# Patient Record
Sex: Female | Born: 1985 | Race: Black or African American | Hispanic: No | Marital: Single | Smoking: Never smoker
Health system: Southern US, Community
[De-identification: ages and names within clinical notes are randomized; demographics above are authoritative.]

## PROBLEM LIST (undated history)

## (undated) DIAGNOSIS — J302 Other seasonal allergic rhinitis: Secondary | ICD-10-CM

## (undated) DIAGNOSIS — K219 Gastro-esophageal reflux disease without esophagitis: Secondary | ICD-10-CM

## (undated) DIAGNOSIS — Z8489 Family history of other specified conditions: Secondary | ICD-10-CM

## (undated) DIAGNOSIS — Q909 Down syndrome, unspecified: Secondary | ICD-10-CM

## (undated) DIAGNOSIS — F329 Major depressive disorder, single episode, unspecified: Secondary | ICD-10-CM

## (undated) DIAGNOSIS — E7800 Pure hypercholesterolemia, unspecified: Secondary | ICD-10-CM

## (undated) DIAGNOSIS — F32A Depression, unspecified: Secondary | ICD-10-CM

## (undated) DIAGNOSIS — J45909 Unspecified asthma, uncomplicated: Secondary | ICD-10-CM

## (undated) HISTORY — PX: EYE SURGERY: SHX253

## (undated) HISTORY — PX: AXILLARY SURGERY: SHX892

## (undated) HISTORY — DX: Unspecified asthma, uncomplicated: J45.909

## (undated) HISTORY — DX: Gastro-esophageal reflux disease without esophagitis: K21.9

## (undated) HISTORY — DX: Down syndrome, unspecified: Q90.9

## (undated) HISTORY — DX: Other seasonal allergic rhinitis: J30.2

## (undated) HISTORY — DX: Depression, unspecified: F32.A

## (undated) HISTORY — PX: OTHER SURGICAL HISTORY: SHX169

## (undated) HISTORY — DX: Major depressive disorder, single episode, unspecified: F32.9

## (undated) HISTORY — PX: TYMPANOSTOMY TUBE PLACEMENT: SHX32

## (undated) HISTORY — DX: Pure hypercholesterolemia, unspecified: E78.00

## (undated) HISTORY — DX: Family history of other specified conditions: Z84.89

---

## 2016-09-13 ENCOUNTER — Encounter: Payer: Self-pay | Admitting: Family Medicine

## 2016-09-13 ENCOUNTER — Ambulatory Visit (INDEPENDENT_AMBULATORY_CARE_PROVIDER_SITE_OTHER): Payer: Medicaid Other | Admitting: Family Medicine

## 2016-09-13 ENCOUNTER — Other Ambulatory Visit: Payer: Self-pay | Admitting: *Deleted

## 2016-09-13 VITALS — BP 116/76 | HR 101 | Temp 98.1°F | Resp 16 | Ht <= 58 in | Wt 215.0 lb

## 2016-09-13 DIAGNOSIS — J309 Allergic rhinitis, unspecified: Secondary | ICD-10-CM

## 2016-09-13 DIAGNOSIS — Q909 Down syndrome, unspecified: Secondary | ICD-10-CM

## 2016-09-13 DIAGNOSIS — Z8639 Personal history of other endocrine, nutritional and metabolic disease: Secondary | ICD-10-CM | POA: Diagnosis not present

## 2016-09-13 DIAGNOSIS — E785 Hyperlipidemia, unspecified: Secondary | ICD-10-CM

## 2016-09-13 DIAGNOSIS — L83 Acanthosis nigricans: Secondary | ICD-10-CM

## 2016-09-13 DIAGNOSIS — K219 Gastro-esophageal reflux disease without esophagitis: Secondary | ICD-10-CM | POA: Diagnosis not present

## 2016-09-13 DIAGNOSIS — E559 Vitamin D deficiency, unspecified: Secondary | ICD-10-CM | POA: Insufficient documentation

## 2016-09-13 DIAGNOSIS — J302 Other seasonal allergic rhinitis: Secondary | ICD-10-CM | POA: Diagnosis not present

## 2016-09-13 DIAGNOSIS — Z8659 Personal history of other mental and behavioral disorders: Secondary | ICD-10-CM | POA: Diagnosis not present

## 2016-09-13 MED ORDER — LORATADINE 10 MG PO TABS: 10.0000 mg | ORAL_TABLET | Freq: Every day | ORAL | 2 refills | Status: DC

## 2016-09-13 MED ORDER — FLUTICASONE PROPIONATE 50 MCG/ACT NA SUSP: 2.0000 | Freq: Every day | NASAL | 5 refills | Status: DC

## 2016-09-13 NOTE — Assessment & Plan Note (Addendum)
BMI >45, recent weight loss 2 lb in 1 year, limited regular exercise. Concern with Down syndrome, at inc risk wt gain, metabolic complication - Check future fasting lipid panel, chemistry, A1c - Encouraged regular exercise and healthy diet, will discuss further at follow-up with lab results, and may need referral to Nutrition center

## 2016-09-13 NOTE — Assessment & Plan Note (Signed)
H/o HLD without prior records. Not on statin therapy. Morbid obesity BMI >45, concern with family history and Down Syndrome. - Check future fasting lipid panel, glucose/A1c

## 2016-09-13 NOTE — Patient Instructions (Signed)
Thank you for coming in to clinic today.  1. It sounds like you have persistent Sinus Congestion or "Rhinosinusitis" - I do not think that this is a Bacterial Sinus Infection. Usually these are caused by Viruses or Allergies - Improve hydration by drinking plenty of clear fluids (water, gatorade) to reduce secretions and thin congestion - Start taking Flonase nasal spray 2 in each nostril each day for next 4 weeks, may need for longer - Start Loratadine (Claritin) 1 month trial - if helping can continue or resume during allergy season - Congestion draining down throat can cause irritation. May try warm herbal tea with honey, cough drops  If you develop persistent fever >101F for at least 3 consecutive days, headaches with sinus pain or pressure or persistent earache, please schedule a follow-up evaluation within next few days to week.  (anytime in next 1-2 weeks)... - Please schedule a "Lab Only" visit (early morning, 8:00am to 9:00am) to get your blood work drawn here at our clinic. - You need to be fasting (No food or drink after midnight, and nothing in the morning before your blood draw). - I have already ordered your blood work, so you may schedule this appointment at your convenience - We can discuss results at next appointment, otherwise our office will contact you with results by phone or with a letter  Please schedule a follow-up appointment with Dr. Althea CharonKaramalegos in 3-4 weeks for Lab Follow-up / Sinusitis Follow-up  Recommend Flu Shot Recommend future Pap Smear cervical cancer screening  Will request records  We may discuss future referrals to other specialist given her history of Down Syndrome  If you have any other questions or concerns, please feel free to call the clinic or send a message through MyChart. You may also schedule an earlier appointment if necessary.  Saralyn PilarAlexander Karamalegos, DO [facility]

## 2016-09-13 NOTE — Assessment & Plan Note (Signed)
On exam, concern for inc risk insulin resistance with morbid obesity Screen for DM with A1c, chemistry

## 2016-09-13 NOTE — Assessment & Plan Note (Signed)
Stable, without active problems. No medications Monitor

## 2016-09-13 NOTE — Assessment & Plan Note (Signed)
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 - Afebrile, well-appearing, sinuses non-tender without purulence  Plan: 1. Reassurance, likely self-limited - no indication for antibiotics at this time 2. Start Claritin 10mg  OTC trial x 1 month, may continue if improved 3. Continue Flonase 2 sprays daily x 1 month, may continue if improved then PRN 4. Return criteria provided 5. Follow-up as planned within 4 weeks for fasting labs, and f/u sinuses
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
## 2016-09-13 NOTE — Assessment & Plan Note (Signed)
Suspect contributing to lingering sinus symptoms. Prior history. Not on any medications Start Loratadine 10mg  daily for 1 month trial and continue as needed Start Flonase

## 2016-09-13 NOTE — Assessment & Plan Note (Signed)
Chronic problem, reported history per Mother (primary caregiver), currently do not have any childhood or recent healthcare records, lab results or testing. 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 No documented history of Atlanto-Axial-Instability (AAI) and no evidence of recent problem, or indication to screen (such as anesthesia/surgery or high risk activity)  Plan: 1. Awaiting records from various locations, [facility] also [facility] [place] - release of records to be obtained by our office 2. Ordered future fasting labs / screening - CMET, Lipids, TSH / Free T4, Vitamin D, A1c 3. Follow-up within 4 weeks review lab results, ideally will have outside records, otherwise if after several months unable to locate records then will consider referrals out to Cardiology, Endocrinology, Ophthalmology to establish care
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
## 2016-09-14 ENCOUNTER — Other Ambulatory Visit: Payer: Self-pay

## 2016-09-14 LAB — CBC WITH DIFFERENTIAL/PLATELET
BASOS PCT: 0 %
Basophils Absolute: 0 cells/uL (ref 0–200)
EOS PCT: 1 %
Eosinophils Absolute: 34 cells/uL (ref 15–500)
HCT: 39.5 % (ref 35.0–45.0)
Hemoglobin: 13.3 g/dL (ref 11.7–15.5)
LYMPHS ABS: 1020 {cells}/uL (ref 850–3900)
Lymphocytes Relative: 30 %
MCH: 33.5 pg — AB (ref 27.0–33.0)
MCHC: 33.7 g/dL (ref 32.0–36.0)
MCV: 99.5 fL (ref 80.0–100.0)
MONOS PCT: 7 %
MPV: 10.1 fL (ref 7.5–12.5)
Monocytes Absolute: 238 cells/uL (ref 200–950)
NEUTROS ABS: 2108 {cells}/uL (ref 1500–7800)
Neutrophils Relative %: 62 %
PLATELETS: 196 10*3/uL (ref 140–400)
RBC: 3.97 MIL/uL (ref 3.80–5.10)
RDW: 15 % (ref 11.0–15.0)
WBC: 3.4 10*3/uL — AB (ref 3.8–10.8)

## 2016-09-14 LAB — COMPLETE METABOLIC PANEL WITH GFR
ALT: 19 U/L (ref 6–29)
AST: 18 U/L (ref 10–30)
Albumin: 3.6 g/dL (ref 3.6–5.1)
Alkaline Phosphatase: 67 U/L (ref 33–115)
BILIRUBIN TOTAL: 0.5 mg/dL (ref 0.2–1.2)
BUN: 7 mg/dL (ref 7–25)
CHLORIDE: 103 mmol/L (ref 98–110)
CO2: 27 mmol/L (ref 20–31)
CREATININE: 0.76 mg/dL (ref 0.50–1.10)
Calcium: 8.3 mg/dL — ABNORMAL LOW (ref 8.6–10.2)
GFR, Est African American: >89 mL/min (ref >=60)
GFR, Est Non African American: >89 mL/min (ref >=60)
GLUCOSE: 88 mg/dL (ref 65–99)
Potassium: 4.4 mmol/L (ref 3.5–5.3)
SODIUM: 141 mmol/L (ref 135–146)
TOTAL PROTEIN: 6.6 g/dL (ref 6.1–8.1)

## 2016-09-14 LAB — LIPID PANEL
Cholesterol: 180 mg/dL (ref 125–200)
HDL: 39 mg/dL — AB (ref >=46)
LDL CALC: 120 mg/dL (ref <130)
Total CHOL/HDL Ratio: 4.6 Ratio (ref <=5.0)
Triglycerides: 105 mg/dL (ref <150)
VLDL: 21 mg/dL (ref <30)

## 2016-09-14 LAB — TSH: TSH: 2.67 m[IU]/L

## 2016-09-14 LAB — T4, FREE: FREE T4: 1.2 ng/dL (ref 0.8–1.8)

## 2016-09-15 LAB — HEMOGLOBIN A1C
HEMOGLOBIN A1C: 5.2 % (ref <5.7)
Mean Plasma Glucose: 103 mg/dL

## 2016-09-15 LAB — VITAMIN D 25 HYDROXY (VIT D DEFICIENCY, FRACTURES): Vit D, 25-Hydroxy: 13 ng/mL — ABNORMAL LOW (ref 30–100)

## 2016-09-19 ENCOUNTER — Other Ambulatory Visit: Payer: Self-pay | Admitting: Family Medicine

## 2016-09-19 DIAGNOSIS — E559 Vitamin D deficiency, unspecified: Secondary | ICD-10-CM

## 2016-09-19 MED ORDER — VITAMIN D3 125 MCG (5000 UT) PO CAPS: 5000.0000 [IU] | ORAL_CAPSULE | Freq: Every day | ORAL | 0 refills | Status: DC

## 2016-10-11 ENCOUNTER — Ambulatory Visit: Payer: Self-pay | Admitting: Family Medicine

## 2016-10-13 ENCOUNTER — Ambulatory Visit (INDEPENDENT_AMBULATORY_CARE_PROVIDER_SITE_OTHER): Payer: Medicaid Other | Admitting: Family Medicine

## 2016-10-13 ENCOUNTER — Encounter: Payer: Self-pay | Admitting: Family Medicine

## 2016-10-13 VITALS — BP 112/76 | HR 70 | Temp 98.8°F | Resp 16 | Ht 59.5 in | Wt 213.6 lb

## 2016-10-13 DIAGNOSIS — E559 Vitamin D deficiency, unspecified: Secondary | ICD-10-CM | POA: Diagnosis not present

## 2016-10-13 DIAGNOSIS — Z8709 Personal history of other diseases of the respiratory system: Secondary | ICD-10-CM | POA: Insufficient documentation

## 2016-10-13 DIAGNOSIS — J309 Allergic rhinitis, unspecified: Secondary | ICD-10-CM

## 2016-10-13 DIAGNOSIS — Z8669 Personal history of other diseases of the nervous system and sense organs: Secondary | ICD-10-CM | POA: Insufficient documentation

## 2016-10-13 DIAGNOSIS — J302 Other seasonal allergic rhinitis: Secondary | ICD-10-CM

## 2016-10-13 NOTE — Assessment & Plan Note (Signed)
Controlled, x 3 weeks on flonase / claritin, now off. Advised she may resume these for 2-4 weeks in future may need for longer if recurrence. - start daily nasal saline

## 2016-10-13 NOTE — Progress Notes (Addendum)
Subjective:    Patient ID: Molly Harris, female    DOB: [birth year], 30 y.o.   MRN: [id number]  Molly Harris is a 30 y.o. female presenting on 10/13/2016 for Sinusitis   HPI   FOLLOW-UP ALLERGIC RHINOSINUSITIS / SEASONAL ALLERGIES: - Chronic history with recurrent URI and sinus infections and seasonal allergies.  - Last visit 9/20, started back on Flonase and Claritin daily, given recurrent allergic sinusitis symptoms, she was not having symptoms of acute sinus infection at that time. - Currently significant improvement. She took both Flonase and Claritin regularly for 3 weeks with resolution of nasal congestion, post nasal drip and cough, and then meds were stopped about 1 week ago - Admits itchy nose - Denies any recent fevers/chills, productive cough, sinus pressure or headache, nausea, vomiting, nasal congestion, ear pain sore throat, nose bleeds  Vitamin D Deficiency - Last visit 09/13/16, had labs done confirming Vit D deficiency with result of 13. She has a prior history of deficiency on labwork >1 year prior, but was not treated and is no longer taking vitamin supplement. Currently she has not started the Vitamin D3 5,000 daily supplement as prescribed due to cost and not covered by ins   Past Medical History:  Diagnosis Date  . Asthma   . Depression   . Down syndrome   . Family history of headaches   . GERD (gastroesophageal reflux disease)   . High cholesterol   . Seasonal allergies    Per HPI unless specifically indicated above     Objective:    BP 112/76 (BP Location: Left Arm, Patient Position: Sitting, Cuff Size: Large)   Pulse 70   Temp 98.8 F (37.1 C) (Oral)   Resp 16   Ht 4' 11.5" (1.511 m)   Wt 213 lb 9.6 oz (96.9 kg)   LMP 10/04/2015   BMI 42.42 kg/m   Wt Readings from Last 3 Encounters:  10/13/16 213 lb 9.6 oz (96.9 kg)  09/13/16 215 lb (97.5 kg)    Physical Exam  Constitutional: She is oriented to person, place, and time. She appears  well-developed and well-nourished. No distress.  Mild Down Syndromic facies, well-appearing, comfortable, cooperative.  HENT:  Head: Normocephalic and atraumatic.  Mouth/Throat: Oropharynx is clear and moist.  Frontal / maxillary sinuses non-tender. Improved bilateral turbinates with reduced edema and resolved congestion. Bilateral TMs clear without erythema, effusion or bulging. Oropharynx clear without erythema, exudates, edema or asymmetry.  Eyes: Conjunctivae are normal Cardiovascular: Normal rate Pulmonary/Chest: Effort normal and breath sounds normal. No respiratory distress. She has no wheezes. She has no rales.  Lymphadenopathy:    She has no cervical adenopathy.  Neurological: She is alert Skin: Skin is warm and dry. No rash noted.  Nursing note and vitals reviewed.  I have personally reviewed lab results below from 09/13/16.  Results for orders placed or performed in visit on 09/13/16  COMPLETE METABOLIC PANEL WITH GFR  Result Value Ref Range   Sodium 141 135 - 146 mmol/L   Potassium 4.4 3.5 - 5.3 mmol/L   Chloride 103 98 - 110 mmol/L   CO2 27 20 - 31 mmol/L   Glucose, Bld 88 65 - 99 mg/dL   BUN 7 7 - 25 mg/dL   Creat 0.76 0.50 - 1.10 mg/dL   Total Bilirubin 0.5 0.2 - 1.2 mg/dL   Alkaline Phosphatase 67 33 - 115 U/L   AST 18 10 - 30 U/L   ALT 19 6 - 29 U/L  Total Protein 6.6 6.1 - 8.1 g/dL   Albumin 3.6 3.6 - 5.1 g/dL   Calcium 8.3 (L) 8.6 - 10.2 mg/dL   GFR, Est African American >89 >=60 mL/min   GFR, Est Non African American >89 >=60 mL/min  Lipid panel  Result Value Ref Range   Cholesterol 180 125 - 200 mg/dL   Triglycerides 105 <150 mg/dL   HDL 39 (L) >=46 mg/dL   Total CHOL/HDL Ratio 4.6 <=5.0 Ratio   VLDL 21 <30 mg/dL   LDL Cholesterol 120 <130 mg/dL  CBC with Differential/Platelet  Result Value Ref Range   WBC 3.4 (L) 3.8 - 10.8 K/uL   RBC 3.97 3.80 - 5.10 MIL/uL   Hemoglobin 13.3 11.7 - 15.5 g/dL   HCT 39.5 35.0 - 45.0 %   MCV 99.5 80.0 - 100.0 fL     MCH 33.5 (H) 27.0 - 33.0 pg   MCHC 33.7 32.0 - 36.0 g/dL   RDW 15.0 11.0 - 15.0 %   Platelets 196 140 - 400 K/uL   MPV 10.1 7.5 - 12.5 fL   Neutro Abs 2,108 1,500 - 7,800 cells/uL   Lymphs Abs 1,020 850 - 3,900 cells/uL   Monocytes Absolute 238 200 - 950 cells/uL   Eosinophils Absolute 34 15 - 500 cells/uL   Basophils Absolute 0 0 - 200 cells/uL   Neutrophils Relative % 62 %   Lymphocytes Relative 30 %   Monocytes Relative 7 %   Eosinophils Relative 1 %   Basophils Relative 0 %   Smear Review Criteria for review not met   TSH  Result Value Ref Range   TSH 2.67 mIU/L  T4, free  Result Value Ref Range   Free T4 1.2 0.8 - 1.8 ng/dL  VITAMIN D 25 Hydroxy (Vit-D Deficiency, Fractures)  Result Value Ref Range   Vit D, 25-Hydroxy 13 (L) 30 - 100 ng/mL  Hemoglobin A1c  Result Value Ref Range   Hgb A1c MFr Bld 5.2 <5.7 %   Mean Plasma Glucose 103 mg/dL      Assessment & Plan:   Problem List Items Addressed This Visit    Vitamin D deficiency    Last checked 08/2016, low at 13 - May start any available OTC 2,000 to 5,000 daily for now, given not covered rx 5,000 daily - If takes supplement, re-check q 3-6 months      Seasonal allergies    Controlled, x 3 weeks on flonase / claritin, now off. Advised she may resume these for 2-4 weeks in future may need for longer if recurrence. - start daily nasal saline      Allergic sinusitis - Primary    Resolved, s/p flonase and claritin x 3 weeks, since stopped, remains controlled. - Advised if symptoms recur, restart both flonase and claritin as soon as possible for another 2-4 week course, may need for longer prevention during certain allergy seasons - Recommend daily nasal saline OTC to prevent worsening, also improve itchy nose       Other Visit Diagnoses   None.     No orders of the defined types were placed in this encounter.     Follow up plan: Return in about 6 months (around 04/13/2017) for physical / routine  follow-up.  Nobie Putnam, [facility] 10/13/2016, 1:08 PM

## 2016-10-13 NOTE — Patient Instructions (Signed)
Thank you for coming in to clinic today.  1. To prevent more sinus problems and help reduce itchy nose - Use Simply Saline or Ocean Spray, normal saline nasal spray over the counter, 1 to 2 sprays each nostril a day as needed  If you get worsening nasal congestion and sinus symptoms, please resume the medicines - Flonase nasal spray 2 in each nostril each day for 2 to 4 weeks at a time then may stop - Loratadine (Claritin) 1 pill daily for 2 to 4 weeks then may stop  Please schedule a follow-up appointment with Dr. Althea CharonKaramalegos in 6 months as needed for next routine check-up  If you have any other questions or concerns, please feel free to call the clinic or send a message through MyChart. You may also schedule an earlier appointment if necessary.  Saralyn PilarAlexander Zakyla Tonche, DO [facility]

## 2016-10-13 NOTE — Assessment & Plan Note (Signed)
Resolved, s/p flonase and claritin x 3 weeks, since stopped, remains controlled. - Advised if symptoms recur, restart both flonase and claritin as soon as possible for another 2-4 week course, may need for longer prevention during certain allergy seasons - Recommend daily nasal saline OTC to prevent worsening, also improve itchy nose

## 2016-10-13 NOTE — Assessment & Plan Note (Addendum)
Last checked 08/2016, low at 13 - May start any available OTC 2,000 to 5,000 daily for now, given not covered rx 5,000 daily - If takes supplement, re-check q 3-6 months

## 2016-11-21 ENCOUNTER — Telehealth: Payer: Self-pay | Admitting: Family Medicine

## 2016-11-21 DIAGNOSIS — Z124 Encounter for screening for malignant neoplasm of cervix: Secondary | ICD-10-CM

## 2016-11-21 NOTE — Telephone Encounter (Signed)
Referral to GYN for routine pap smear screening.  [facility] [address], [address] [place], [place] [address] Hours: 8am - 5pm Main: [phone]  May notify patient's mother that referral was placed and she will be notified to scheduled in future.  Saralyn PilarAlexander Daylee Delahoz, DO [facility] 11/21/2016, 5:00 PM

## 2016-11-21 NOTE — Telephone Encounter (Signed)
Rene KocherRegina asked if pt needed a referral to see GYN.  Her call back number is [phone]

## 2017-01-18 ENCOUNTER — Encounter: Payer: Medicaid Other | Admitting: Obstetrics and Gynecology

## 2017-02-21 ENCOUNTER — Encounter: Payer: Medicaid Other | Admitting: Obstetrics and Gynecology

## 2017-04-10 ENCOUNTER — Encounter: Payer: Medicaid Other | Admitting: Obstetrics and Gynecology

## 2017-04-19 ENCOUNTER — Encounter: Payer: Self-pay | Admitting: Nurse Practitioner

## 2017-04-19 ENCOUNTER — Ambulatory Visit (INDEPENDENT_AMBULATORY_CARE_PROVIDER_SITE_OTHER): Payer: Medicaid Other | Admitting: Nurse Practitioner

## 2017-04-19 VITALS — BP 103/67 | HR 86 | Temp 98.7°F | Resp 16 | Ht 59.5 in | Wt 210.0 lb

## 2017-04-19 DIAGNOSIS — R1110 Vomiting, unspecified: Secondary | ICD-10-CM

## 2017-04-19 DIAGNOSIS — L659 Nonscarring hair loss, unspecified: Secondary | ICD-10-CM | POA: Diagnosis not present

## 2017-04-19 DIAGNOSIS — E559 Vitamin D deficiency, unspecified: Secondary | ICD-10-CM | POA: Diagnosis not present

## 2017-04-19 DIAGNOSIS — R1115 Cyclical vomiting syndrome unrelated to migraine: Secondary | ICD-10-CM

## 2017-04-19 DIAGNOSIS — K219 Gastro-esophageal reflux disease without esophagitis: Secondary | ICD-10-CM | POA: Diagnosis not present

## 2017-04-19 MED ORDER — PANTOPRAZOLE SODIUM 40 MG PO TBEC: 40.0000 mg | DELAYED_RELEASE_TABLET | Freq: Every day | ORAL | 1 refills | Status: AC

## 2017-04-19 NOTE — Progress Notes (Signed)
Subjective:    Patient ID: Molly Harris, female    DOB: 30-Dec-1985, 31 y.o.   MRN: [id number]  Molly Harris is a 31 y.o. female presenting on 04/19/2017 for Emesis (Patient here today with her mother ([name]) who reports that pt has been vomiting at least once a day x's several weeks. Patient's mother reports vomiting after eating. [name] is also concerned about patients thyroid. ) and Alopecia Molly Harris has down's syndrome and is mostly non-verbal.  She is able to indicate yes and no, but is shy when communicating with provider.  Rollene Fare is helpful with obtaining some answers, however overall poor history.    HPI  Hair loss Patient's mother states she is having clumps of hair break off.  She is noticing significant breakage and hair loss pattern at the hair line.  Recently washed and not noticeable today.  She states Molly Harris's hair has shortened in length by about 7 inches without a haircut.    Vomiting Vomiting daily at least once per day that occurs about 1 hour after eating that has lasted for months according to pts mother.  Vomiting occurs usually after breakfast and if 2nd occurrence it occurs after dinner.  Heaves sometimes without vomiting. Denies abdominal pain.  She has no known coffee ground emesis, blood in stool, or black/tarry stool.  Mother states she goes to the bathroom frequently, but Takeshia prefers her privacy and mother is unable to provide information about stool characteristics or actual BM frequency.  Pt has a history of GERD and has not had any recent symptoms of heartburn.  Sometimes she complains of headaches, but [place] indicates it has not been hurting lately.    She only eats two meals per day.  Breakfast is a small meal with cereal or a slightly larger meal with a couple pancakes with bacon/sausage and fruit or juice. Dinner is often a moderately sized meal with sandwich and salad, or meat with veggies and bread.  She does have some snacks through the  day and they are usually fruit or a package of mini-muffins.   Vitamin D deficiency Rollene Fare notes they have stopped taking the vitamin D supplement.  She is concerned about vitamin D levels and desires to have them checked again today.   Past Medical History:  Diagnosis Date  . Asthma   . Depression   . Down syndrome   . Family history of headaches   . GERD (gastroesophageal reflux disease)   . High cholesterol   . Seasonal allergies     Social History  Substance Use Topics  . Smoking status: Never Smoker  . Smokeless tobacco: Never Used  . Alcohol use No    Review of Systems Per HPI unless specifically indicated above     Objective:    BP 103/67 (BP Location: Left Arm, Patient Position: Sitting, Cuff Size: Large)   Pulse 86   Temp 98.7 F (37.1 C) (Oral)   Resp 16   Ht 4' 11.5" (1.511 m)   Wt 210 lb (95.3 kg)   LMP 02/23/2017 (Within Weeks)   SpO2 99%   BMI 41.71 kg/m   Wt Readings from Last 3 Encounters:  04/19/17 210 lb (95.3 kg)  10/13/16 213 lb 9.6 oz (96.9 kg)  09/13/16 215 lb (97.5 kg)     Physical Exam  Constitutional: She appears well-developed and well-nourished. No distress.  obese  HENT:  Head: Normocephalic and atraumatic.  Nose: Nose normal.  Mouth/Throat: Oropharynx is clear and moist. No  oropharyngeal exudate.  Alopecia noted from hair line with significant thinning to 1 inch from hair line.  No bald spots noted elsewhere.  Eyes: Conjunctivae are normal. Pupils are equal, round, and reactive to light.  Neck: Normal range of motion. Neck supple. No JVD present. No tracheal deviation present. No thyromegaly present.  Cardiovascular: Normal rate, regular rhythm, normal heart sounds and intact distal pulses.   Pulmonary/Chest: Effort normal and breath sounds normal. No respiratory distress.  Abdominal: Soft. Bowel sounds are normal. She exhibits no distension and no mass. There is no tenderness. There is no rebound and no guarding.    Musculoskeletal: She exhibits no edema.  Lymphadenopathy:    She has no cervical adenopathy.  Neurological: She is alert.  Skin: Skin is warm and dry.  Minimal hair on legs.  Psychiatric: She has a normal mood and affect. Her behavior is normal.     Results for orders placed or performed in visit on 09/13/16  COMPLETE METABOLIC PANEL WITH GFR  Result Value Ref Range   Sodium 141 135 - 146 mmol/L   Potassium 4.4 3.5 - 5.3 mmol/L   Chloride 103 98 - 110 mmol/L   CO2 27 20 - 31 mmol/L   Glucose, Bld 88 65 - 99 mg/dL   BUN 7 7 - 25 mg/dL   Creat 0.76 0.50 - 1.10 mg/dL   Total Bilirubin 0.5 0.2 - 1.2 mg/dL   Alkaline Phosphatase 67 33 - 115 U/L   AST 18 10 - 30 U/L   ALT 19 6 - 29 U/L   Total Protein 6.6 6.1 - 8.1 g/dL   Albumin 3.6 3.6 - 5.1 g/dL   Calcium 8.3 (L) 8.6 - 10.2 mg/dL   GFR, Est African American >89 >=60 mL/min   GFR, Est Non African American >89 >=60 mL/min  Lipid panel  Result Value Ref Range   Cholesterol 180 125 - 200 mg/dL   Triglycerides 105 <150 mg/dL   HDL 39 (L) >=46 mg/dL   Total CHOL/HDL Ratio 4.6 <=5.0 Ratio   VLDL 21 <30 mg/dL   LDL Cholesterol 120 <130 mg/dL  CBC with Differential/Platelet  Result Value Ref Range   WBC 3.4 (L) 3.8 - 10.8 K/uL   RBC 3.97 3.80 - 5.10 MIL/uL   Hemoglobin 13.3 11.7 - 15.5 g/dL   HCT 39.5 35.0 - 45.0 %   MCV 99.5 80.0 - 100.0 fL   MCH 33.5 (H) 27.0 - 33.0 pg   MCHC 33.7 32.0 - 36.0 g/dL   RDW 15.0 11.0 - 15.0 %   Platelets 196 140 - 400 K/uL   MPV 10.1 7.5 - 12.5 fL   Neutro Abs 2,108 1,500 - 7,800 cells/uL   Lymphs Abs 1,020 850 - 3,900 cells/uL   Monocytes Absolute 238 200 - 950 cells/uL   Eosinophils Absolute 34 15 - 500 cells/uL   Basophils Absolute 0 0 - 200 cells/uL   Neutrophils Relative % 62 %   Lymphocytes Relative 30 %   Monocytes Relative 7 %   Eosinophils Relative 1 %   Basophils Relative 0 %   Smear Review Criteria for review not met   TSH  Result Value Ref Range   TSH 2.67 mIU/L  T4, free   Result Value Ref Range   Free T4 1.2 0.8 - 1.8 ng/dL  VITAMIN D 25 Hydroxy (Vit-D Deficiency, Fractures)  Result Value Ref Range   Vit D, 25-Hydroxy 13 (L) 30 - 100 ng/mL  Hemoglobin A1c  Result Value  Ref Range   Hgb A1c MFr Bld 5.2 <5.7 %   Mean Plasma Glucose 103 mg/dL      Assessment & Plan:   Problem List Items Addressed This Visit      Digestive   GERD (gastroesophageal reflux disease)    Patient with history of GERD and recurrent vomiting.  Plan: 1. Initiate PPI with protonix 40 mg Take one tablet once daily. 2. Evaluate for efficacy against vomiting.  Take for 4 weeks and stop if symptoms do not improve.      Relevant Medications   pantoprazole (PROTONIX) 40 MG tablet     Other   Vitamin D deficiency    Last checked 08/2016, low at 13.  Pt started OTC supplementation for several weeks, but has stopped.    Plan: 1. Recheck Vitamin D today. 2. Vitamin D Rx not covered.  Continue OTC supplementation at 3,000-5,000 IU daily if vitamin D still low after labs.      Relevant Orders   Vitamin D 1,25 dihydroxy    Other Visit Diagnoses    Persistent vomiting in adult patient    -  Primary Onset months and unchanged in that time.  Vomiting 1-2x daily about 1 hour after breakfast and sometimes dinner.  Plan: 1. Start Protonix 40 mg tablet take one tablet once daily and evaluate for improvement of symptoms. 2. Obtain abdominal US evaluate for any anatomical changes. 3. BMP to evaluate electrolytes in presence of chronic emesis.   Relevant Medications   pantoprazole (PROTONIX) 40 MG tablet   Other Relevant Orders   US Abdomen Complete   Basic Metabolic Panel (BMET)   Hair loss     Persistent breakage and hair loss at hair line. TSH = 2.67 (normal) at last check in 08/2016.  1. Recheck TSH   Relevant Orders   TSH      Meds ordered this encounter  Medications  . pantoprazole (PROTONIX) 40 MG tablet    Sig: Take 1 tablet (40 mg total) by mouth daily.     Dispense:  30 tablet    Refill:  1    Order Specific Question:   Supervising Provider    Answer:   Olin Hauser [[id number]]    Follow up plan: Return in about 3 months (around 07/19/2017) for GI, vit. D deficiency or sooner if lab tests indicate or symptoms persist.   Cassell Smiles, DNP, AGPCNP-BC Adult Gerontology Primary Care Nurse Practitioner [facility] 04/20/2017, 8:45 AM

## 2017-04-19 NOTE — Patient Instructions (Addendum)
Molly Harris, Thank you for coming in to clinic today.  1. For your vomiting, - we will get an abdominal ultrasound. - START protonix 40 mg once every day before breakfast.  2. For the hair loss: - We will check your thyroid level.    3. For your vitamin D deficiency: - we will recheck vitamin D level with labs.  Return to the clinic tomorrow morning between 8-11:30.  Please schedule a follow-up appointment with Wilhelmina Mcardle, AGNP in 3 months or sooner if we need to review tests or ongoing symptoms.  If you have any other questions or concerns, please feel free to call the clinic or send a message through MyChart. You may also schedule an earlier appointment if necessary.  Wilhelmina Mcardle, DNP, AGNP-BC Adult Gerontology Nurse Practitioner [facility]   Nausea and Vomiting, Adult Nausea is the feeling that you have an upset stomach or have to vomit. As nausea gets worse, it can lead to vomiting. Vomiting occurs when stomach contents are thrown up and out of the mouth. Vomiting can make you feel weak and cause you to become dehydrated. Dehydration can make you tired and thirsty, cause you to have a dry mouth, and decrease how often you urinate. Older adults and people with other diseases or a weak immune system are at higher risk for dehydration. It is important to treat your nausea and vomiting as told by your health care provider. Follow these instructions at home: Follow instructions from your health care provider about how to care for yourself at home. Eating and drinking  Follow these recommendations as told by your health care provider:  Take an oral rehydration solution (ORS). This is a drink that is sold at pharmacies and retail stores.  Drink clear fluids in small amounts as you are able. Clear fluids include water, ice chips, diluted fruit juice, and low-calorie sports drinks.  Eat bland, easy-to-digest foods in small amounts as you are able. These foods  include bananas, applesauce, rice, lean meats, toast, and crackers.  Avoid fluids that contain a lot of sugar or caffeine, such as energy drinks, sports drinks, and soda.  Avoid alcohol.  Avoid spicy or fatty foods. General instructions   Drink enough fluid to keep your urine clear or pale yellow.  Wash your hands often. If soap and water are not available, use hand sanitizer.  Make sure that all people in your household wash their hands well and often.  Take over-the-counter and prescription medicines only as told by your health care provider.  Rest at home while you recover.  Watch your condition for any changes.  Breathe slowly and deeply when you feel nauseated.  Keep all follow-up visits as told by your health care provider. This is important. Contact a health care provider if:  You have a fever.  You cannot keep fluids down.  Your symptoms get worse.  You have new symptoms.  Your nausea does not go away after two days.  You feel light-headed or dizzy.  You have a headache.  You have muscle cramps. Get help right away if:  You have pain in your chest, neck, arm, or jaw.  You feel extremely weak or you faint.  You have persistent vomiting.  You see blood in your vomit.  Your vomit looks like black coffee grounds.  You have bloody or black stools or stools that look like tar.  You have a severe headache, a stiff neck, or both.  You have a rash.  You have severe pain, cramping, or bloating in your abdomen.  You have trouble breathing or you are breathing very quickly.  Your heart is beating very quickly.  Your skin feels cold and clammy.  You feel confused.  You have pain when you urinate.  You have signs of dehydration, such as:  Dark urine, very little urine, or no urine.  Cracked lips.  Dry mouth.  Sunken eyes.  Sleepiness.  Weakness. These symptoms may represent a serious problem that is an emergency. Do not wait to see if the  symptoms will go away. Get medical help right away. Call your local emergency services (911 in the [place] Do not drive yourself to the hospital. This information is not intended to replace advice given to you by your health care provider. Make sure you discuss any questions you have with your health care provider. Document Released: 12/11/2005 Document Revised: 05/15/2016 Document Reviewed: 08/17/2015 Elsevier Interactive Patient Education  2017 [employer].

## 2017-04-20 ENCOUNTER — Other Ambulatory Visit: Payer: Medicaid Other

## 2017-04-20 ENCOUNTER — Encounter: Payer: Self-pay | Admitting: Nurse Practitioner

## 2017-04-20 NOTE — Assessment & Plan Note (Signed)
Patient with history of GERD and recurrent vomiting.  Plan: 1. Initiate PPI with protonix 40 mg Take one tablet once daily. 2. Evaluate for efficacy against vomiting.  Take for 4 weeks and stop if symptoms do not improve.

## 2017-04-20 NOTE — Assessment & Plan Note (Signed)
Last checked 08/2016, low at 13.  Pt started OTC supplementation for several weeks, but has stopped.    Plan: 1. Recheck Vitamin D today. 2. Vitamin D Rx not covered.  Continue OTC supplementation at 3,000-5,000 IU daily if vitamin D still low after labs.

## 2017-04-20 NOTE — Progress Notes (Signed)
I have reviewed this encounter including the documentation in this note and/or discussed this patient with the provider, Wilhelmina Mcardle, AGPCNP-BC. I am certifying that I agree with the content of this note as supervising physician.  Saralyn Pilar, DO [facility] 04/20/2017, 2:58 PM

## 2017-04-21 LAB — BASIC METABOLIC PANEL WITH GFR
BUN: 7 mg/dL (ref 7–25)
CO2: 26 mmol/L (ref 20–31)
Calcium: 8.1 mg/dL — ABNORMAL LOW (ref 8.6–10.2)
Chloride: 104 mmol/L (ref 98–110)
Creat: 0.74 mg/dL (ref 0.50–1.10)
GFR, Est African American: >89 mL/min (ref >=60)
GFR, Est Non African American: >89 mL/min (ref >=60)
Glucose, Bld: 83 mg/dL (ref 65–99)
Potassium: 4 mmol/L (ref 3.5–5.3)
Sodium: 140 mmol/L (ref 135–146)

## 2017-04-21 LAB — TSH: TSH: 5.07 mIU/L — ABNORMAL HIGH

## 2017-04-23 ENCOUNTER — Telehealth: Payer: Self-pay

## 2017-04-23 MED ORDER — LEVOTHYROXINE SODIUM 25 MCG PO TABS: 25.0000 ug | ORAL_TABLET | Freq: Every day | ORAL | 1 refills | Status: DC

## 2017-04-23 NOTE — Telephone Encounter (Signed)
Patient advised as below. Patient verbalizes understanding and is in agreement with treatment plan.  

## 2017-04-23 NOTE — Addendum Note (Signed)
Addended by: Wilhelmina Mcardle R on: 04/23/2017 10:18 AM   Modules accepted: Orders

## 2017-04-23 NOTE — Telephone Encounter (Signed)
-----   Message from Galen Manila, NP sent at 04/23/2017 10:17 AM EDT ----- Your TSH is elevated which means you are not getting enough thyroid hormone.  We will treat this with levothyroxine 25 mcg tablet.  - Take one levothyroxine 25 mcg tablet every morning 1 hour before eating or drinking anything other than water.  - If you are taking any multivitamins, take them with lunch or dinner.  Calcium, phosphorous, and iron can affect the absorption of levothyroxine. - It is important to get your medication from the same pharmacy each time so your medicine is always made by the same manufacturer.  If the manufacturer changes, you could absorb the levothyroxine differently and need more or less replacement.   - It is important not to miss doses. If you miss a dose, you can take two pills the next morning after the missed dose. - It can take 6 weeks for TSH to normalize after starting levothyroxine.  We will recheck TSH lab in 6 weeks. Please schedule a labs only visit.  These are not fasting labs, so you can eat breakfast. Take levothyroxine that morning like you normally would.  Prescription was sent to [facility] on file in [place].

## 2017-04-25 LAB — VITAMIN D 1,25 DIHYDROXY
Vitamin D 1, 25 (OH)2 Total: 48 pg/mL (ref 18–72)
Vitamin D2 1, 25 (OH)2: 8 pg/mL
Vitamin D3 1, 25 (OH)2: 40 pg/mL

## 2017-04-26 ENCOUNTER — Telehealth: Payer: Self-pay

## 2017-04-26 ENCOUNTER — Ambulatory Visit: Payer: Self-pay

## 2017-04-26 NOTE — Telephone Encounter (Signed)
-----   Message from Galen ManilaLauren Renee Kennedy, NP sent at 04/26/2017 10:37 AM EDT ----- Current vitamin D supplementation with OTC vitamin D is sufficient.  Continue supplementation with [phone] IU vitamin D3 daily.  We will recheck Vitamin D at her next appointment with labs.

## 2017-04-26 NOTE — Telephone Encounter (Signed)
Patient's mother advised as below. Patient verbalizes understanding and is in agreement with treatment plan.

## 2017-05-02 ENCOUNTER — Ambulatory Visit: Payer: Self-pay

## 2017-05-03 ENCOUNTER — Telehealth: Payer: Self-pay | Admitting: Nurse Practitioner

## 2017-05-03 NOTE — Telephone Encounter (Signed)
Enrique SackKendra with [facility] need a prior authorization by 2:00 today for pt's scheduled US.  The location of US needs to be [facility] to [facility].  The NPI number is [id number].  Her call back number is [phone]

## 2017-05-03 NOTE — Telephone Encounter (Signed)
Facility changed with NPI provided.

## 2017-05-03 NOTE — Telephone Encounter (Signed)
Pt has approval number is work que which is [id number] valid from 04/30/17 till 01/25/2018.

## 2017-05-04 ENCOUNTER — Ambulatory Visit
Admission: RE | Admit: 2017-05-04 | Discharge: 2017-05-04 | Disposition: A | Discharge status: Home / Self Care | Payer: Medicaid Other | Source: Ambulatory Visit | Attending: Nurse Practitioner | Admitting: Nurse Practitioner

## 2017-05-04 DIAGNOSIS — R1110 Vomiting, unspecified: Secondary | ICD-10-CM | POA: Insufficient documentation

## 2017-05-04 DIAGNOSIS — K760 Fatty (change of) liver, not elsewhere classified: Secondary | ICD-10-CM | POA: Diagnosis not present

## 2017-05-04 DIAGNOSIS — R1115 Cyclical vomiting syndrome unrelated to migraine: Secondary | ICD-10-CM

## 2017-05-25 ENCOUNTER — Other Ambulatory Visit: Payer: Self-pay | Admitting: Family Medicine

## 2017-05-25 DIAGNOSIS — L659 Nonscarring hair loss, unspecified: Secondary | ICD-10-CM

## 2018-12-08 IMAGING — US US ABDOMEN COMPLETE
1 series · 14 of 25 positions shown · non-contrast
Comparison: None.

CLINICAL DATA: Daily vomiting for approximately 1 year.

EXAM:
ABDOMEN ULTRASOUND COMPLETE

[Series 1: us abdomen complete · 0.23mm/px · 14 of 99 slices shown]
[im 1/99]
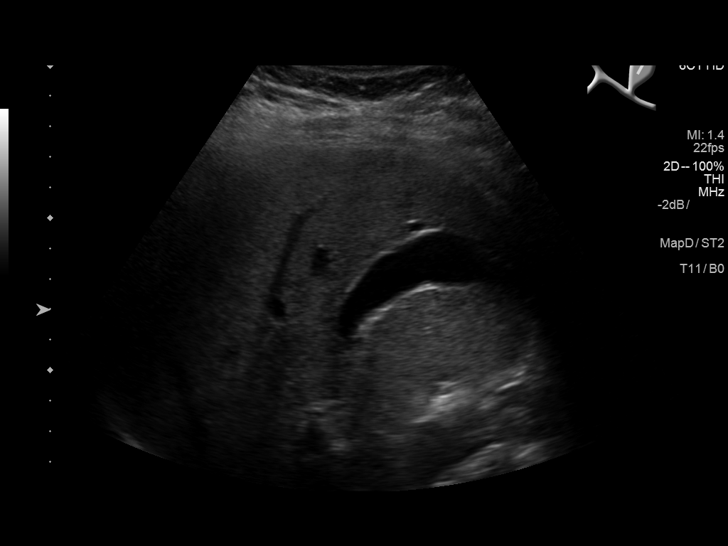
[im 9/99]
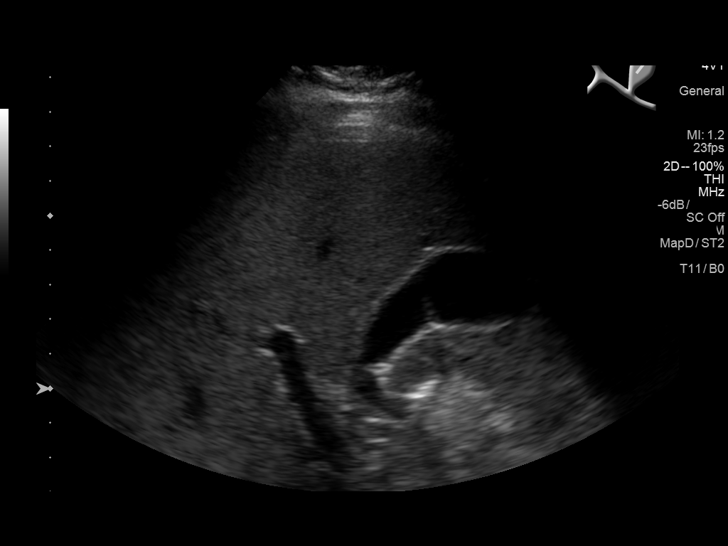
[im 17/99]
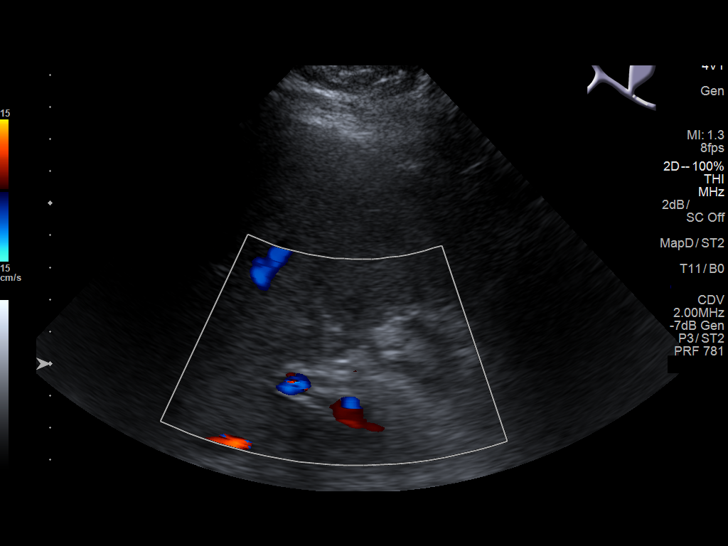
[im 25/99]
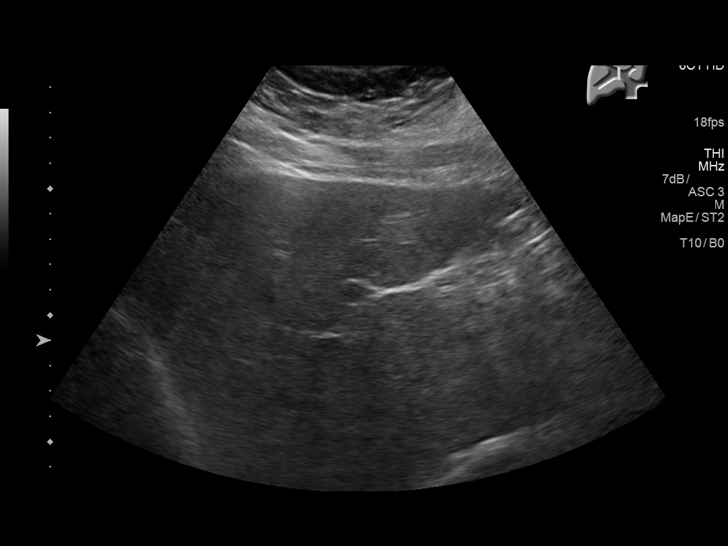
[im 33/99]
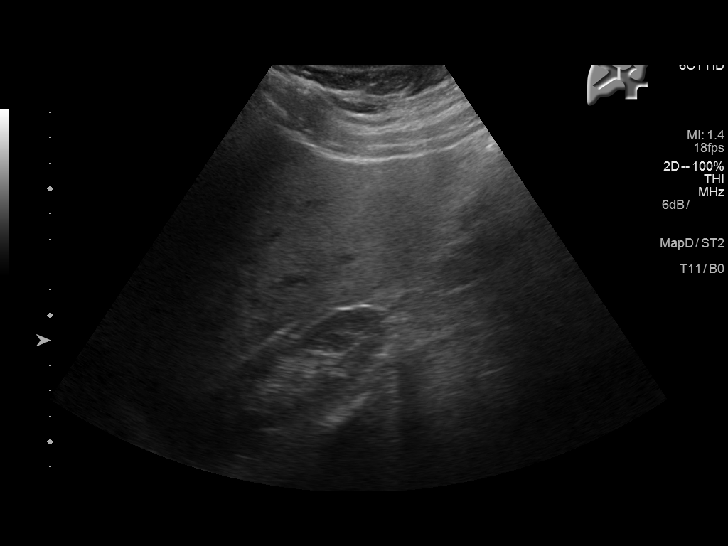
[im 37/99]
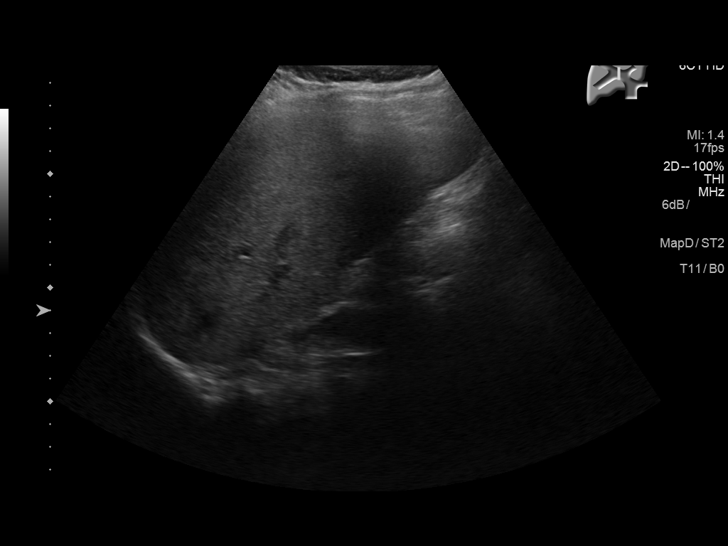
[im 45/99]
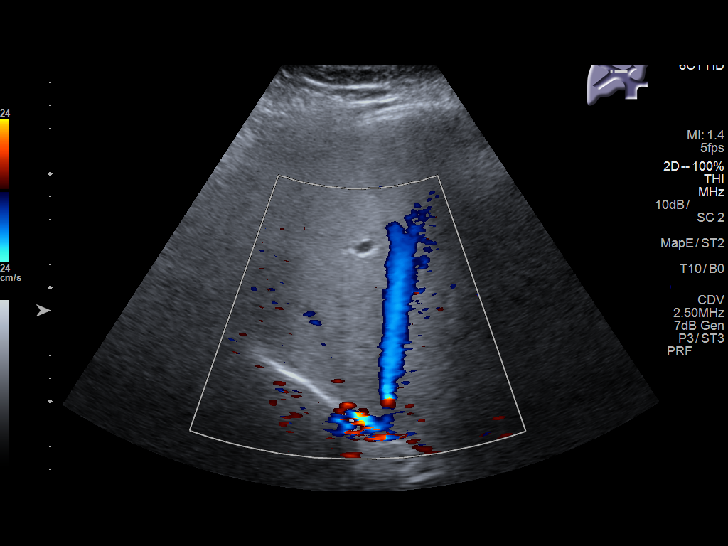
[im 54/99]
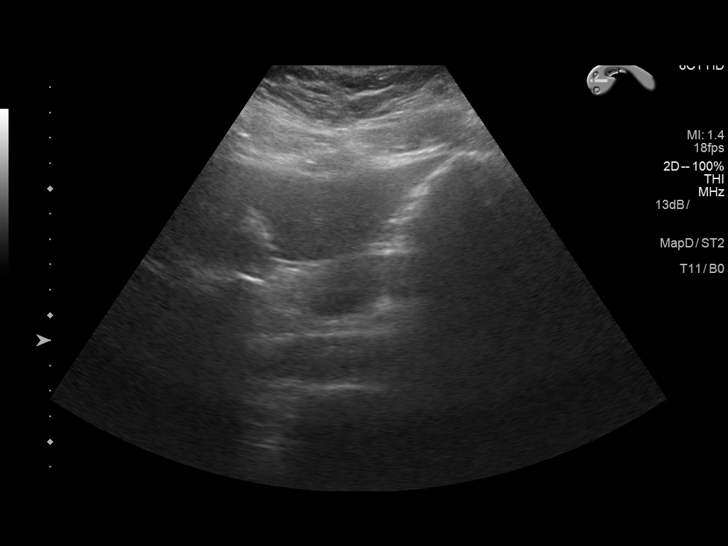
[im 62/99]
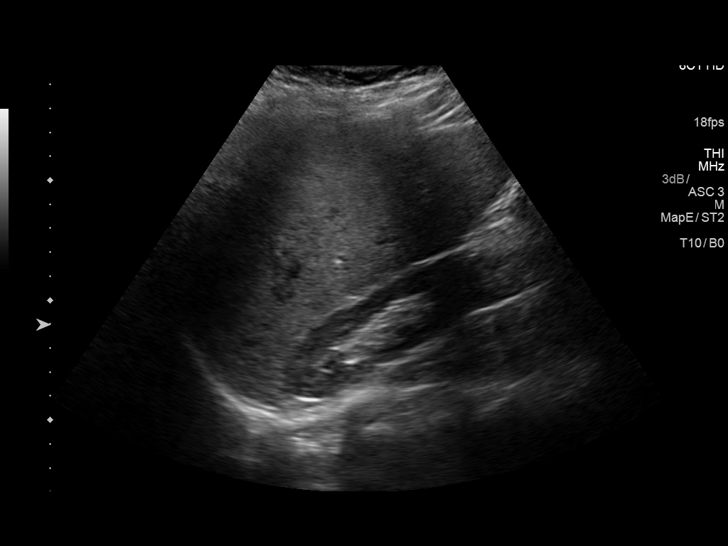
[im 66/99]
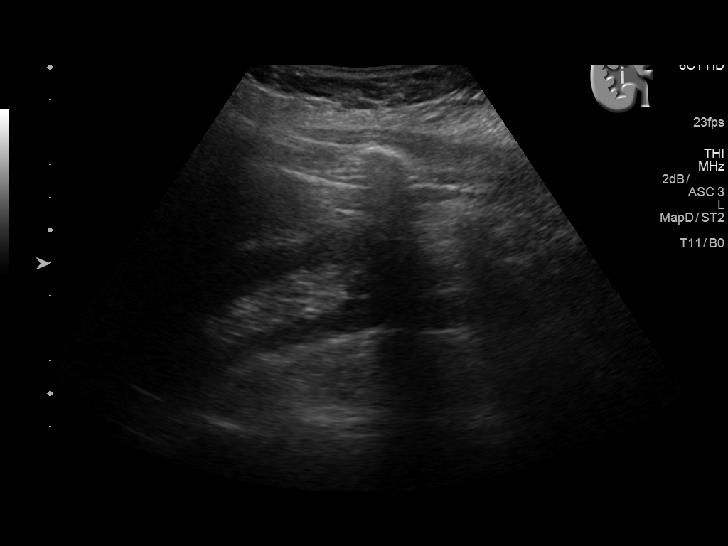
[im 74/99]
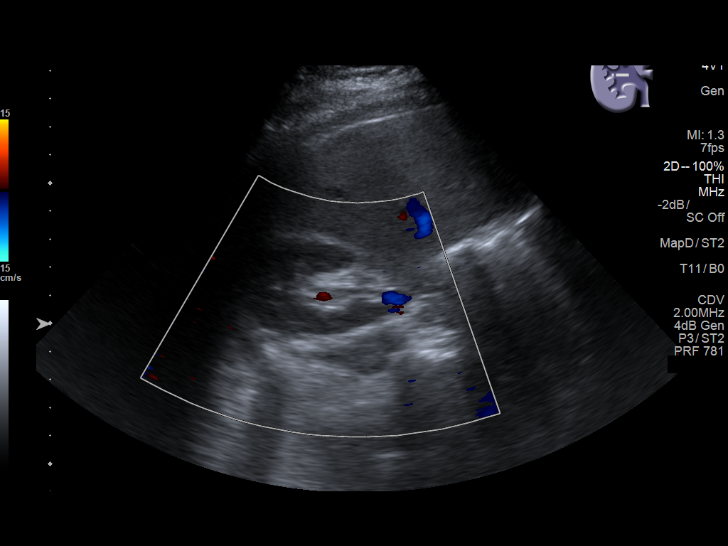
[im 82/99]
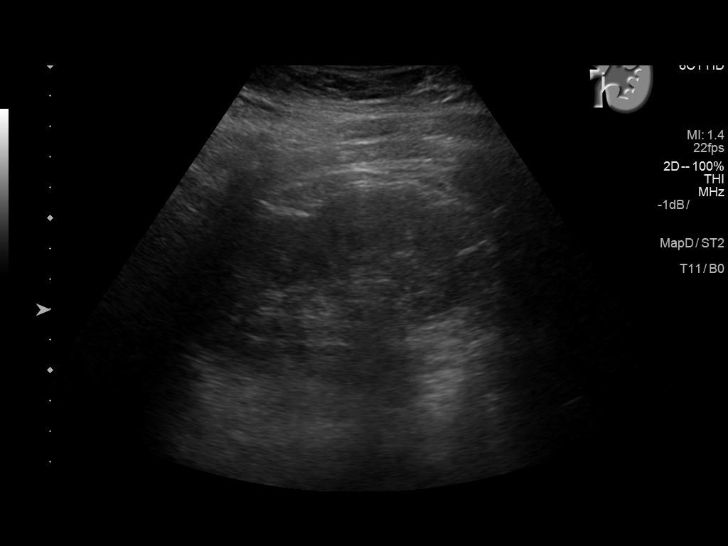
[im 90/99]
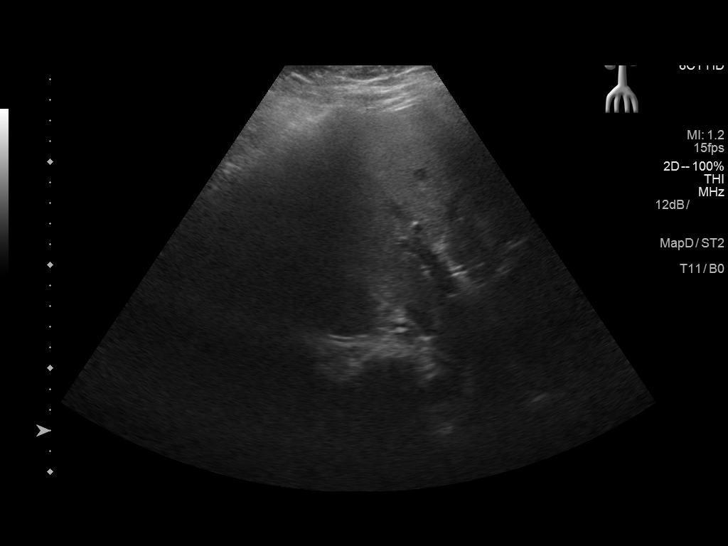
[im 99/99]
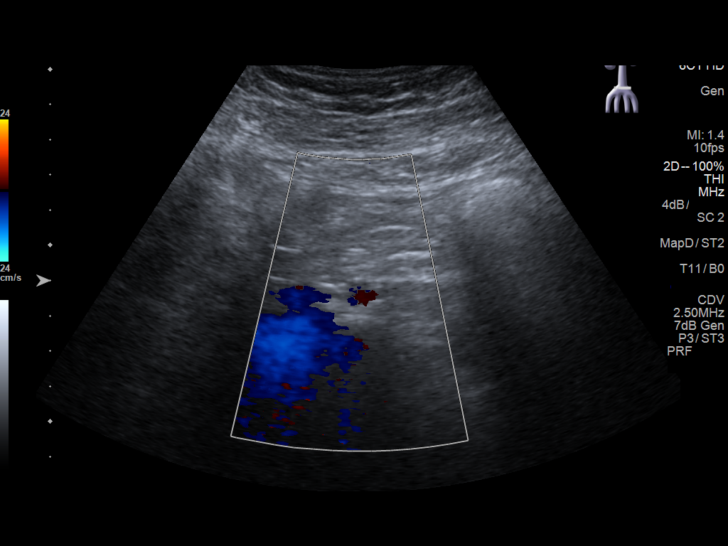

[14 of 25 positions shown; findings below may reference images not displayed]

FINDINGS: Gallbladder: No gallstones or wall thickening visualized. No
sonographic Murphy sign noted by sonographer.

Common bile duct: Diameter: 3 mm, within normal limits.

Liver: Mildly increased echogenicity of the hepatic parenchyma,
consistent with mild hepatic steatosis. No focal mass lesion
identified.

IVC: No abnormality visualized.

Pancreas: Visualized portion unremarkable.

Spleen: Size and appearance within normal limits.

Right Kidney: Length: 9.3 cm. Echogenicity within normal limits. No
mass or hydronephrosis visualized.

Left Kidney: Length: 9.7 cm. Echogenicity within normal limits. No
mass or hydronephrosis visualized.

Abdominal aorta: No aneurysm visualized.

Other findings: None.
IMPRESSION: No evidence of cholelithiasis, hydronephrosis, or other acute
findings.

Mild hepatic steatosis
